# Patient Record
Sex: Female | Born: 1980 | Race: Black or African American | Hispanic: No | Marital: Single | Smoking: Never smoker
Health system: Southern US, Community
[De-identification: ages and names within clinical notes are randomized; demographics above are authoritative.]

## PROBLEM LIST (undated history)

## (undated) DIAGNOSIS — R51 Headache: Secondary | ICD-10-CM

## (undated) DIAGNOSIS — H6690 Otitis media, unspecified, unspecified ear: Secondary | ICD-10-CM

## (undated) DIAGNOSIS — D649 Anemia, unspecified: Secondary | ICD-10-CM

## (undated) HISTORY — PX: OTHER SURGICAL HISTORY: SHX169

## (undated) HISTORY — PX: MYRINGOTOMY: SUR874

## (undated) HISTORY — PX: TONSILLECTOMY: SUR1361

---
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
## 2005-01-05 ENCOUNTER — Other Ambulatory Visit: Admission: RE | Admit: 2005-01-05 | Discharge: 2005-01-05 | Payer: Self-pay | Admitting: Obstetrics and Gynecology

## 2005-05-07 ENCOUNTER — Emergency Department (HOSPITAL_COMMUNITY): Admission: EM | Admit: 2005-05-07 | Discharge: 2005-05-07 | Payer: Self-pay | Admitting: Family Medicine

## 2005-11-26 ENCOUNTER — Ambulatory Visit (HOSPITAL_COMMUNITY): Admission: RE | Admit: 2005-11-26 | Discharge: 2005-11-26 | Payer: Self-pay | Admitting: Obstetrics and Gynecology

## 2005-12-04 ENCOUNTER — Observation Stay (HOSPITAL_COMMUNITY): Admission: AD | Admit: 2005-12-04 | Discharge: 2005-12-04 | Payer: Self-pay | Admitting: Obstetrics and Gynecology

## 2005-12-07 ENCOUNTER — Inpatient Hospital Stay (HOSPITAL_COMMUNITY): Admission: AD | Admit: 2005-12-07 | Discharge: 2005-12-07 | Payer: Self-pay | Admitting: Obstetrics & Gynecology

## 2005-12-10 ENCOUNTER — Inpatient Hospital Stay (HOSPITAL_COMMUNITY): Admission: AD | Admit: 2005-12-10 | Discharge: 2005-12-10 | Payer: Self-pay | Admitting: Obstetrics & Gynecology

## 2005-12-17 ENCOUNTER — Inpatient Hospital Stay (HOSPITAL_COMMUNITY): Admission: AD | Admit: 2005-12-17 | Discharge: 2005-12-17 | Payer: Self-pay | Admitting: Obstetrics & Gynecology

## 2005-12-23 ENCOUNTER — Inpatient Hospital Stay (HOSPITAL_COMMUNITY): Admission: AD | Admit: 2005-12-23 | Discharge: 2005-12-23 | Payer: Self-pay | Admitting: Obstetrics and Gynecology

## 2005-12-31 ENCOUNTER — Inpatient Hospital Stay (HOSPITAL_COMMUNITY): Admission: AD | Admit: 2005-12-31 | Discharge: 2005-12-31 | Payer: Self-pay | Admitting: Obstetrics and Gynecology

## 2006-01-07 ENCOUNTER — Inpatient Hospital Stay (HOSPITAL_COMMUNITY): Admission: AD | Admit: 2006-01-07 | Discharge: 2006-01-07 | Payer: Self-pay | Admitting: Obstetrics & Gynecology

## 2006-01-14 ENCOUNTER — Inpatient Hospital Stay (HOSPITAL_COMMUNITY): Admission: AD | Admit: 2006-01-14 | Discharge: 2006-01-14 | Payer: Self-pay | Admitting: Obstetrics & Gynecology

## 2007-08-05 ENCOUNTER — Inpatient Hospital Stay (HOSPITAL_COMMUNITY): Admission: AD | Admit: 2007-08-05 | Discharge: 2007-08-05 | Payer: Self-pay | Admitting: Obstetrics and Gynecology

## 2008-03-23 ENCOUNTER — Inpatient Hospital Stay (HOSPITAL_COMMUNITY): Admission: AD | Admit: 2008-03-23 | Discharge: 2008-03-26 | Payer: Self-pay | Admitting: Obstetrics & Gynecology

## 2008-03-28 ENCOUNTER — Encounter: Admission: RE | Admit: 2008-03-28 | Discharge: 2008-04-27 | Payer: Self-pay | Admitting: Obstetrics and Gynecology

## 2008-04-28 ENCOUNTER — Encounter: Admission: RE | Admit: 2008-04-28 | Discharge: 2008-05-02 | Payer: Self-pay | Admitting: Obstetrics and Gynecology

## 2010-06-28 NOTE — L&D Delivery Note (Signed)
Delivery Note At 5:13 PM a viable female was delivered via Vaginal, Spontaneous Delivery (Presentation: Right Occiput Anterior).  APGAR:8,4, 8; 8#9.6 weight .   Placenta status: Intact, Spontaneous.  Cord: 3 vessels with the following complications: .  Cord pH: 7.28  Pt pushed well. With crowning of the infants head turtling was noted and an extra delivery assistance was requested.  With delivery of the infants head the anterior shoulder also came easily. The posterior shoulder and body also delivered easily.  The infant cried vigorously after delivery and had good tone.  The infant was then passed to the maternal abdomen.  However after a brief amount of time on the mothers abdomen the infant was noted to be poorly responsive.  The cord was immediately clamped and cut and the infant was passed to the waiting isolet and a code apgar was called. Please refer to the neonatal resuscitation note for complete record of the infants resuscitation.  Cord blood was collected for the lab and blood banking.  The placenta was delivered spontaneously, intact with 3V cord.  A 1st degree perineal laceration was repaired with 3-0 vicryl.  Baby was doing well but was transferred to the Nursery for close observation following delivery.  Anesthesia: Epidural  Episiotomy: None Lacerations: 1st degree;Perineal Suture Repair: 3.0 vicryl Est. Blood Loss (mL): 300  Mom to postpartum.  Baby to nursery-stable.  ROSS,KENDRA H. 02/24/2011, 5:48 PM

## 2010-07-15 ENCOUNTER — Ambulatory Visit (HOSPITAL_COMMUNITY)
Admission: RE | Admit: 2010-07-15 | Discharge: 2010-07-15 | Payer: Self-pay | Source: Home / Self Care | Attending: Obstetrics and Gynecology | Admitting: Obstetrics and Gynecology

## 2010-07-18 ENCOUNTER — Other Ambulatory Visit (HOSPITAL_COMMUNITY): Payer: Self-pay | Admitting: Obstetrics and Gynecology

## 2010-07-18 DIAGNOSIS — Z3682 Encounter for antenatal screening for nuchal translucency: Secondary | ICD-10-CM

## 2010-07-29 ENCOUNTER — Other Ambulatory Visit (HOSPITAL_COMMUNITY): Payer: Self-pay

## 2010-07-29 ENCOUNTER — Other Ambulatory Visit (HOSPITAL_COMMUNITY): Payer: Self-pay | Admitting: Obstetrics and Gynecology

## 2010-07-29 ENCOUNTER — Ambulatory Visit (HOSPITAL_COMMUNITY)
Admission: RE | Admit: 2010-07-29 | Discharge: 2010-07-29 | Disposition: A | Discharge status: Home / Self Care | Payer: 59 | Source: Ambulatory Visit | Attending: Obstetrics and Gynecology | Admitting: Obstetrics and Gynecology

## 2010-07-29 DIAGNOSIS — O358XX0 Maternal care for other (suspected) fetal abnormality and damage, not applicable or unspecified: Secondary | ICD-10-CM | POA: Insufficient documentation

## 2010-07-29 DIAGNOSIS — Z3689 Encounter for other specified antenatal screening: Secondary | ICD-10-CM | POA: Insufficient documentation

## 2010-07-29 DIAGNOSIS — Z3682 Encounter for antenatal screening for nuchal translucency: Secondary | ICD-10-CM

## 2010-08-06 ENCOUNTER — Inpatient Hospital Stay (HOSPITAL_COMMUNITY)
Admission: AD | Admit: 2010-08-06 | Discharge: 2010-08-06 | Disposition: A | Discharge status: Home / Self Care | Payer: 59 | Source: Ambulatory Visit | Attending: Obstetrics and Gynecology | Admitting: Obstetrics and Gynecology

## 2010-08-06 DIAGNOSIS — O210 Mild hyperemesis gravidarum: Secondary | ICD-10-CM | POA: Insufficient documentation

## 2010-08-06 LAB — URINALYSIS, ROUTINE W REFLEX MICROSCOPIC
Leukocytes, UA: NEGATIVE
Urine Glucose, Fasting: NEGATIVE mg/dL
pH: 6 (ref 5.0–8.0)

## 2010-08-06 LAB — URINE MICROSCOPIC-ADD ON

## 2010-08-12 LAB — ANTIBODY SCREEN: Antibody Screen: NEGATIVE

## 2010-08-12 LAB — RPR: RPR: NONREACTIVE

## 2010-08-12 LAB — HIV ANTIBODY (ROUTINE TESTING W REFLEX): HIV: NONREACTIVE

## 2010-08-12 LAB — HEPATITIS B SURFACE ANTIGEN: Hepatitis B Surface Ag: NEGATIVE

## 2010-11-10 NOTE — Consult Note (Signed)
Leah Cochran, Leah Cochran              ACCOUNT NO.:  [id number]   MEDICAL RECORD NO.:  [id number]          PATIENT TYPE:  INP   LOCATION:  9136                          FACILITY:  [facility]   PHYSICIAN:  Melvyn Novas, M.D.  DATE OF BIRTH:  Oct 24, 1980   DATE OF CONSULTATION:  DATE OF DISCHARGE:  03/26/2008                                 CONSULTATION   REASON FOR CONSULTATION:  This is a 30 year old woman 2 days postpartum  who presented to her primary care doctor with slurred speech.   HISTORY OF PRESENT ILLNESS:  According to family, approximately 2 days  post birth the patient had a stuttering-like speech.  However, the  patient has also been slightly depressed post delivery.  Upon interview,  the patient would stare forward, had staccato-like speech.  When asked  what was wrong, the patient stuttered stating, I don't know.  The  patient was not cooperative with exam and would sit staring at  television.  When asked to participate, the patient would not respond.  The patient did look toward were voices were coming from and would move  all extremities during exam.  During exam, the patient also was noted to  start crying.  According to family, this is not her normal typical  demeanor.  Usually, she is an outward young female.  Today, according to  the family, it was explained to the patient that she is to be  discharged.  On hearing about today being her discharge date is when her  staccato-like speech started and the staring spells started.  We were  consulted primarily to rule out possible seizure or reasons for abnormal  speech.   PAST MEDICAL HISTORY:  Includes a negative past medical history.   MEDICATIONS:  Negative for medications other than multivitamin.   REVIEW OF SYSTEMS:  All 12 review of systems were reviewed and the  patient showed to be negative for all 12 review of systems.   PHYSICAL EXAM:  VITAL SIGNS:  Stable.  Blood pressure being 130/80,  pulse of 72, respiration  of 18, temperature of 98.5.  MENTAL STATUS:  The patient, as stated, was alert.  However, she was not  obeying commands.  She would answer questions at this time but would not  follow fingers or move extremities to commands.  CRANIAL NERVES:  Pupils were equal, round, and reactive to light and  accommodating.  She did have a conjugate gaze when spontaneously moving  eyes.  Extraocular muscles were intact.  She had negative ptosis.  Negative visual field deficit.  The patient did blink to threat.  Negative nystagmus.  Face was symmetrical.  Tongue was midline.  Uvula  was midline.  The patient did have with a staccato-like speech and did  answer questions.  She had no facial droop.  Sensation V1-V3 were  intact.  The patient did have a normal shoulder shrug and head turn.  The patient would not follow commands as far finger-to-nose, heel-to-  shin or fine motor movements.  Gait was deferred.  MOTOR:  She is moving all 4 extremities with 5/5 strength spontaneously  with deep tendon reflexes all being 2+ globally.  She had bilaterally  downgoing toes.  She had no tremor, no asterixis, no clonus, no  fasciculations.  She had good bulk and tone.  DRIFT:  The patient had negative drift of her upper or lower extremities  PULMONARY:  She was clear to auscultation bilaterally.  No rhonchi or  wheezing.  CARDIOVASCULAR:  S1, S2 audible.  She had no bruits.  No murmurs.  NECK:  Supple with no bruits.  SENSATION:  To pinprick globally intact as the patient winced to  pinprick with the upper and lower extremities, and also pulled away when  plantar surface of the foot was stroked.  This should be added, the  patient did have strange demeanor as she was initially in a staring-like  spell when first interviewed, answering I don't know what is wrong,  then broke out into a crying phase and started moving extremities  without a problem.   IDENTIFICATION:  Labs CT of head without contrast showed the  brain has a  normal appearance without evidence of atrophy, infarction, mass, lesion,  hemorrhage, hydrocephalous, or extra-axial collections.  Impression was  normal EEG at this time.  Conclusion was normal study in awake, drowsy  and sleep states.  Uric acid and was found to be within normal limits at  3.9.  LVH was found to be within normal limits at 148.  Sodium was 133,  potassium 3.7, chloride 103, bicarb 24, glucose 83, BUN 3, creatinine of  0.51.  White blood cells 10, hemoglobin/hematocrit 10.8 and 32.6 with  platelet count of 227,000.   ASSESSMENT:  At this time, most likely this is not a seizure, stroke or  headache.  Most likely this is a non-organic pathology.  We will be  consulting psychology to see this patient.  This could be a form of  postpartum depression as the patient was found to have started symptoms  today which is the same day and she was told that she was to be  discharged.  The patient had no trauma during birth and has never  reviewed this type of behavior prior to this event.  As stated, the EEG  was normal and CT came back normal.   PLAN:  At this time, will follow up on any other lab results and on what  psych evaluation shows.  Thank you very much for this consultation.  If  you have any more questions, please contact Dr. Vickey Huger.     ______________________________  Felicie Morn, PA-C      Melvyn Novas, M.D.  Electronically Signed    DS/MEDQ  D:  04/08/2008  T:  04/08/2008  Job:  [id number]

## 2010-11-10 NOTE — Procedures (Signed)
ATTENDING PHYSICIAN:  Gerrit Friends. Aldona Bar, MD   CLINICAL HISTORY:  A 30 year old woman 2 days postpartum with slurred  speech.  EEG is performed for evaluation.   DESCRIPTION:  The dominant waking rhythm in this tracing is a moderate-  to-high amplitude alpha rhythm of 10 Hz which predominates posteriorly,  appears without abnormal asymmetry, and attenuates with eye opening and  closing.  Low amplitude fast activity is seen frontally and centrally  and appears without abnormal asymmetry.  No focal slowing is noted and  no epileptiform disorders are seen.  Most of the recording is made in  drowsiness and sleep, the stage II sleep dominates the record as  evidenced by abundant sleep spindles and some vertex waves and K  complexes.  No abnormalities are seen in drowsiness or sleep.  Photic  stimulation and hyperventilation were not performed.  Single-channel  devoted EKG revealed sinus rhythm throughout with rate approximately 90  beats per minute.   CONCLUSION:  Normal study in awake, drowsy, and sleep states.      Michael L. Thad Ranger, M.D.  Electronically Signed     ZOX:WRUE  D:  03/26/2008 16:21:24  T:  03/27/2008 04:06:23  Job #:  [id number]

## 2011-02-22 ENCOUNTER — Telehealth (HOSPITAL_COMMUNITY): Payer: Self-pay | Admitting: *Deleted

## 2011-02-22 ENCOUNTER — Encounter (HOSPITAL_COMMUNITY): Payer: Self-pay | Admitting: *Deleted

## 2011-02-22 NOTE — Telephone Encounter (Signed)
Preadmission screen  

## 2011-02-24 ENCOUNTER — Encounter (HOSPITAL_COMMUNITY): Payer: Self-pay

## 2011-02-24 ENCOUNTER — Encounter (HOSPITAL_COMMUNITY): Payer: Self-pay | Admitting: Anesthesiology

## 2011-02-24 ENCOUNTER — Inpatient Hospital Stay (HOSPITAL_COMMUNITY): Payer: 59 | Admitting: Anesthesiology

## 2011-02-24 ENCOUNTER — Inpatient Hospital Stay (HOSPITAL_COMMUNITY)
Admission: RE | Admit: 2011-02-24 | Discharge: 2011-02-26 | DRG: 775 | Disposition: A | Discharge status: Home / Self Care | Payer: 59 | Source: Ambulatory Visit | Attending: Obstetrics and Gynecology | Admitting: Obstetrics and Gynecology

## 2011-02-24 DIAGNOSIS — O480 Post-term pregnancy: Principal | ICD-10-CM | POA: Diagnosis present

## 2011-02-24 LAB — CBC
HCT: 36 % (ref 36.0–46.0)
Hemoglobin: 11.8 g/dL — ABNORMAL LOW (ref 12.0–15.0)
MCH: 28.1 pg (ref 26.0–34.0)
MCHC: 32.8 g/dL (ref 30.0–36.0)
RBC: 4.2 MIL/uL (ref 3.87–5.11)

## 2011-02-24 LAB — RPR: RPR Ser Ql: NONREACTIVE

## 2011-02-24 MED ORDER — BENZOCAINE-MENTHOL 20-0.5 % EX AERO: 1.0000 "application " | INHALATION_SPRAY | CUTANEOUS | Status: DC | PRN

## 2011-02-24 MED ORDER — DIBUCAINE 1 % RE OINT: 1.0000 "application " | TOPICAL_OINTMENT | RECTAL | Status: DC | PRN

## 2011-02-24 MED ORDER — ZOLPIDEM TARTRATE 5 MG PO TABS: 5.0000 mg | ORAL_TABLET | Freq: Every evening | ORAL | Status: DC | PRN

## 2011-02-24 MED ORDER — CITRIC ACID-SODIUM CITRATE 334-500 MG/5ML PO SOLN: 30.0000 mL | ORAL | Status: DC | PRN

## 2011-02-24 MED ORDER — EPHEDRINE 5 MG/ML INJ
10.0000 mg | INTRAVENOUS | Status: DC | PRN
  Administered 2011-02-24: 10 mg via INTRAVENOUS
  Filled 2011-02-24 (×3): qty 4

## 2011-02-24 MED ORDER — DIPHENHYDRAMINE HCL 25 MG PO CAPS: 25.0000 mg | ORAL_CAPSULE | Freq: Four times a day (QID) | ORAL | Status: DC | PRN

## 2011-02-24 MED ORDER — LACTATED RINGERS IV SOLN
500.0000 mL | INTRAVENOUS | Status: DC | PRN
  Administered 2011-02-24: 500 mL via INTRAVENOUS
  Administered 2011-02-24: 1000 mL via INTRAVENOUS

## 2011-02-24 MED ORDER — LACTATED RINGERS IV SOLN
INTRAVENOUS | Status: DC
  Administered 2011-02-24 (×3): 125 mL/h via INTRAVENOUS

## 2011-02-24 MED ORDER — WITCH HAZEL-GLYCERIN EX PADS: 1.0000 "application " | MEDICATED_PAD | CUTANEOUS | Status: DC | PRN

## 2011-02-24 MED ORDER — ONDANSETRON HCL 4 MG/2ML IJ SOLN: 4.0000 mg | Freq: Four times a day (QID) | INTRAMUSCULAR | Status: DC | PRN

## 2011-02-24 MED ORDER — LACTATED RINGERS IV SOLN: 500.0000 mL | Freq: Once | INTRAVENOUS | Status: DC

## 2011-02-24 MED ORDER — SENNOSIDES-DOCUSATE SODIUM 8.6-50 MG PO TABS
2.0000 | ORAL_TABLET | Freq: Every day | ORAL | Status: DC
  Administered 2011-02-24 – 2011-02-25 (×2): 2 via ORAL

## 2011-02-24 MED ORDER — FLEET ENEMA 7-19 GM/118ML RE ENEM: 1.0000 | ENEMA | RECTAL | Status: DC | PRN

## 2011-02-24 MED ORDER — FENTANYL 2.5 MCG/ML BUPIVACAINE 1/10 % EPIDURAL INFUSION (WH - ANES)
INTRAMUSCULAR | Status: DC | PRN
  Administered 2011-02-24: 14 mL/h via EPIDURAL

## 2011-02-24 MED ORDER — TETANUS-DIPHTH-ACELL PERTUSSIS 5-2.5-18.5 LF-MCG/0.5 IM SUSP
0.5000 mL | Freq: Once | INTRAMUSCULAR | Status: AC
  Administered 2011-02-25: 0.5 mL via INTRAMUSCULAR
  Filled 2011-02-24: qty 0.5

## 2011-02-24 MED ORDER — OXYTOCIN 20 UNITS IN LACTATED RINGERS INFUSION - SIMPLE
1.0000 m[IU]/min | INTRAVENOUS | Status: DC
  Administered 2011-02-24: 2 m[IU]/min via INTRAVENOUS
  Filled 2011-02-24: qty 1000

## 2011-02-24 MED ORDER — BENZOCAINE-MENTHOL 20-0.5 % EX AERO
INHALATION_SPRAY | CUTANEOUS | Status: AC
  Filled 2011-02-24: qty 56

## 2011-02-24 MED ORDER — TERBUTALINE SULFATE 1 MG/ML IJ SOLN: 0.2500 mg | Freq: Once | INTRAMUSCULAR | Status: DC | PRN

## 2011-02-24 MED ORDER — LANOLIN HYDROUS EX OINT: TOPICAL_OINTMENT | CUTANEOUS | Status: DC | PRN

## 2011-02-24 MED ORDER — ACETAMINOPHEN 325 MG PO TABS: 650.0000 mg | ORAL_TABLET | ORAL | Status: DC | PRN

## 2011-02-24 MED ORDER — IBUPROFEN 600 MG PO TABS: 600.0000 mg | ORAL_TABLET | Freq: Four times a day (QID) | ORAL | Status: DC | PRN

## 2011-02-24 MED ORDER — ONDANSETRON HCL 4 MG PO TABS: 4.0000 mg | ORAL_TABLET | ORAL | Status: DC | PRN

## 2011-02-24 MED ORDER — DIPHENHYDRAMINE HCL 50 MG/ML IJ SOLN: 12.5000 mg | INTRAMUSCULAR | Status: DC | PRN

## 2011-02-24 MED ORDER — OXYTOCIN BOLUS FROM INFUSION
500.0000 mL | Freq: Once | INTRAVENOUS | Status: AC
  Administered 2011-02-24: 500 mL via INTRAVENOUS
  Filled 2011-02-24: qty 500

## 2011-02-24 MED ORDER — LIDOCAINE HCL (PF) 1 % IJ SOLN
30.0000 mL | INTRAMUSCULAR | Status: DC | PRN
  Filled 2011-02-24: qty 30

## 2011-02-24 MED ORDER — NALBUPHINE SYRINGE 5 MG/0.5 ML
10.0000 mg | INJECTION | INTRAMUSCULAR | Status: DC | PRN
  Filled 2011-02-24: qty 1

## 2011-02-24 MED ORDER — PENICILLIN G POTASSIUM 5000000 UNITS IJ SOLR
5.0000 10*6.[IU] | Freq: Once | INTRAVENOUS | Status: DC
  Filled 2011-02-24: qty 5

## 2011-02-24 MED ORDER — OXYTOCIN 20 UNITS IN LACTATED RINGERS INFUSION - SIMPLE: 125.0000 mL/h | INTRAVENOUS | Status: DC

## 2011-02-24 MED ORDER — PHENYLEPHRINE 40 MCG/ML (10ML) SYRINGE FOR IV PUSH (FOR BLOOD PRESSURE SUPPORT)
80.0000 ug | PREFILLED_SYRINGE | INTRAVENOUS | Status: DC | PRN
  Filled 2011-02-24 (×2): qty 5

## 2011-02-24 MED ORDER — EPHEDRINE 5 MG/ML INJ
10.0000 mg | INTRAVENOUS | Status: DC | PRN
  Administered 2011-02-24: 10 mg via INTRAVENOUS
  Filled 2011-02-24: qty 4

## 2011-02-24 MED ORDER — OXYCODONE-ACETAMINOPHEN 5-325 MG PO TABS
1.0000 | ORAL_TABLET | ORAL | Status: DC | PRN
  Administered 2011-02-25: 1 via ORAL
  Filled 2011-02-24: qty 1

## 2011-02-24 MED ORDER — SIMETHICONE 80 MG PO CHEW: 80.0000 mg | CHEWABLE_TABLET | ORAL | Status: DC | PRN

## 2011-02-24 MED ORDER — PRENATAL PLUS 27-1 MG PO TABS
1.0000 | ORAL_TABLET | Freq: Every day | ORAL | Status: DC
  Administered 2011-02-25: 1 via ORAL
  Filled 2011-02-24 (×2): qty 1

## 2011-02-24 MED ORDER — OXYCODONE-ACETAMINOPHEN 5-325 MG PO TABS: 2.0000 | ORAL_TABLET | ORAL | Status: DC | PRN

## 2011-02-24 MED ORDER — PHENYLEPHRINE 40 MCG/ML (10ML) SYRINGE FOR IV PUSH (FOR BLOOD PRESSURE SUPPORT)
80.0000 ug | PREFILLED_SYRINGE | INTRAVENOUS | Status: DC | PRN
  Filled 2011-02-24: qty 5

## 2011-02-24 MED ORDER — IBUPROFEN 600 MG PO TABS
600.0000 mg | ORAL_TABLET | Freq: Four times a day (QID) | ORAL | Status: DC
  Administered 2011-02-24 – 2011-02-26 (×6): 600 mg via ORAL
  Filled 2011-02-24 (×7): qty 1

## 2011-02-24 MED ORDER — LIDOCAINE HCL 1.5 % IJ SOLN
INTRAMUSCULAR | Status: DC | PRN
  Administered 2011-02-24 (×2): 5 mL via EPIDURAL

## 2011-02-24 MED ORDER — FENTANYL 2.5 MCG/ML BUPIVACAINE 1/10 % EPIDURAL INFUSION (WH - ANES)
14.0000 mL/h | INTRAMUSCULAR | Status: DC
  Administered 2011-02-24 (×3): 14 mL/h via EPIDURAL
  Filled 2011-02-24 (×3): qty 60

## 2011-02-24 MED ORDER — ONDANSETRON HCL 4 MG/2ML IJ SOLN: 4.0000 mg | INTRAMUSCULAR | Status: DC | PRN

## 2011-02-24 MED ORDER — PENICILLIN G POTASSIUM 5000000 UNITS IJ SOLR
2.5000 10*6.[IU] | INTRAVENOUS | Status: DC
  Filled 2011-02-24 (×5): qty 2.5

## 2011-02-24 NOTE — Anesthesia Procedure Notes (Signed)
Epidural Patient location during procedure: OB Start time: 02/24/2011 10:23 AM End time: 02/24/2011 10:28 AM Reason for block: procedure for pain  Staffing Anesthesiologist: Sandrea Hughs Performed by: anesthesiologist   Preanesthetic Checklist Completed: patient identified, site marked, surgical consent, pre-op evaluation, timeout performed, IV checked, risks and benefits discussed and monitors and equipment checked  Epidural Patient position: sitting Prep: site prepped and draped and DuraPrep Patient monitoring: continuous pulse ox and blood pressure Approach: midline Injection technique: LOR air  Needle:  Needle type: Tuohy  Needle gauge: 17 G Needle length: 9 cm Needle insertion depth: 6 cm Catheter type: closed end flexible Catheter size: 19 Gauge Catheter at skin depth: 11 cm Test dose: negative and 1.5% lidocaine  Assessment Events: blood not aspirated, injection not painful, no injection resistance, negative IV test and no paresthesia

## 2011-02-24 NOTE — Progress Notes (Signed)
Sternal rub,calling pt by name.  Pt's response is delayed and speech slow and slightly slurred

## 2011-02-24 NOTE — Progress Notes (Signed)
Pt states she is starting to feel better.  

## 2011-02-24 NOTE — Progress Notes (Signed)
Possible inaccurately reading of pulse rate.  According to pulse ox pt's pulse actually decreased to < 60 bpm

## 2011-02-24 NOTE — Anesthesia Preprocedure Evaluation (Signed)
Anesthesia Evaluation  Name, MR# and DOB Patient awake  General Assessment Comment  Reviewed: Allergy & Precautions, H&P , NPO status , Patient's Chart, lab work & pertinent test results  Airway Mallampati: I TM Distance: >3 FB Neck ROM: full    Dental No notable dental hx.    Pulmonary  clear to auscultation  pulmonary exam normalPulmonary Exam Normal breath sounds clear to auscultation none    Cardiovascular     Neuro/Psych Negative Neurological ROS  Negative Psych ROS  GI/Hepatic/Renal negative GI ROS  negative Liver ROS  negative Renal ROS        Endo/Other  Negative Endocrine ROS (+)      Abdominal Normal abdominal exam  (+)   Musculoskeletal negative musculoskeletal ROS (+)   Hematology negative hematology ROS (+)   Peds  Reproductive/Obstetrics (+) Pregnancy    Anesthesia Other Findings             Anesthesia Physical Anesthesia Plan  ASA: II  Anesthesia Plan: Epidural   Post-op Pain Management:    Induction:   Airway Management Planned:   Additional Equipment:   Intra-op Plan:   Post-operative Plan:   Informed Consent: I have reviewed the patients History and Physical, chart, labs and discussed the procedure including the risks, benefits and alternatives for the proposed anesthesia with the patient or authorized representative who has indicated his/her understanding and acceptance.     Plan Discussed with:   Anesthesia Plan Comments:         Anesthesia Quick Evaluation  

## 2011-02-24 NOTE — Progress Notes (Signed)
Prior to epidural pt happy, outgoing and apprehensive.  After epidural pt not outgoing, talkative, does not act apprehensive will affect almost being flat

## 2011-02-24 NOTE — H&P (Signed)
Leah Cochran is a 30 y.o. female [phone] presenting for postdates IOL @ 41 weeks History OB History    Grav Para Term Preterm Abortions TAB SAB Ect Mult Living   4 1 1  2 1  1  1      Past Medical History  Diagnosis Date  . No pertinent past medical history    Past Surgical History  Procedure Date  . Appendectomy 1999  . Tonsillectomy     30 years old  . Myringotomy     child   Family History: family history includes Cancer in her maternal grandfather and paternal grandfather; Diabetes in her paternal grandmother; Hypertension in her paternal grandmother; and Mental retardation in her cousin. Social History:  reports that she has never smoked. She does not have any smokeless tobacco history on file. She reports that she does not drink alcohol or use illicit drugs.  ROS    Temperature 97.9 F (36.6 C), temperature source Oral, resp. rate 20. Exam Physical Exam   Gen: AOX3 Abd: gravid, soft NT Cvx: 4/50/-2 per last office exam  Prenatal labs: ABO, Rh: O/Positive/-- (02/15 0000) Antibody: Negative (02/15 0000) Rubella:   RPR: Nonreactive, Nonreactive, Nonreactive (02/15 0000)  HBsAg: Negative (02/15 0000)  HIV: Non-reactive, Non-reactive, Non-reactive (02/15 0000)  GBS: Negative (07/20 0000)   Assessment/Plan: 30 yo G4P1021 @ 41 weeks for PD IOL Admit Pitocin + AROM when able Epidural on request  ROSS,KENDRA H. 02/24/2011, 8:02 AM

## 2011-02-24 NOTE — Progress Notes (Signed)
Dr Arby Barrette here and several RN's Epedrine 10 mg ivp given.  HOB lowered

## 2011-02-24 NOTE — Progress Notes (Signed)
Pt watching tv,  Then seemed to be in a trance.  Emergency call initiated

## 2011-02-24 NOTE — Progress Notes (Signed)
  Pt obtained epidural. After epidural pt was noted to be lethargic and poorly responsive. BP was low 70/50. Pt was given IVF bolus and ephedrine and became appropriately responsive. Pt noted during the episode that she wanted to speak but could not make the words come out.  No evidence of seizure activity was noted.  Pt feeling better now. Comfortable with epidural.  FHT 130s with accels, no decels reactive cvx 4-5/ 70/-2 toco irregular q2-5  AROM for chartreuse tinged fluid  A/P FWB reassuring Hypotension resolved Continue pitocin

## 2011-02-24 NOTE — Progress Notes (Signed)
Dr Tenny Craw talked with pt reaffirming that pt's inability to respond to RN was due to the drop in bp

## 2011-02-24 NOTE — Anesthesia Postprocedure Evaluation (Signed)
Anesthesia Post Note  Patient: Leah Cochran  Procedure(s) Performed: * No procedures listed *  Anesthesia type: Epidural  Patient location: Mother/Baby  Post pain: Pain level controlled  Post assessment: Post-op Vital signs reviewed  Last Vitals:  Filed Vitals:   02/24/11 1801  BP: 121/68  Pulse: 92  Temp:   Resp: 18    Post vital signs: Reviewed  Level of consciousness: awake  Complications: No apparent anesthesia complications

## 2011-02-24 NOTE — Progress Notes (Signed)
Pt had similar response as at 1045, not as severe.  Pt able to talk to nurse

## 2011-02-24 NOTE — Progress Notes (Signed)
Dr Tenny Craw here.  Told about earlier events.  Will hold off AROM and restart Pitocin about 1200

## 2011-02-24 NOTE — Progress Notes (Signed)
Pt assisted to bedside commode.  Voided without problem.  Pericare given.and pt assisted to w/c

## 2011-02-25 ENCOUNTER — Encounter (HOSPITAL_COMMUNITY): Payer: Self-pay

## 2011-02-25 LAB — CBC
HCT: 31.7 % — ABNORMAL LOW (ref 36.0–46.0)
MCV: 86.1 fL (ref 78.0–100.0)
RDW: 16.3 % — ABNORMAL HIGH (ref 11.5–15.5)
WBC: 11.5 10*3/uL — ABNORMAL HIGH (ref 4.0–10.5)

## 2011-02-25 NOTE — Progress Notes (Signed)
Appears to be doing well.  Breast feeding.  CBC today:  10.2/11.5  219 K Platelets.  Baby to be circ'ed today.

## 2011-02-25 NOTE — Progress Notes (Signed)
Encounter addended by: Doreene Burke on: 02/25/2011  8:21 AM<BR>     Documentation filed: Notes Section, Charges VN

## 2011-02-25 NOTE — Anesthesia Postprocedure Evaluation (Signed)
Anesthesia Post Note  Patient: Leah Cochran  Procedure(s) Performed: * No procedures listed *  Anesthesia type: Epidural  Patient location: Mother/Baby  Post pain: Pain level controlled  Post assessment: Post-op Vital signs reviewed  Last Vitals:  Filed Vitals:   02/25/11 0100  BP: 102/61  Pulse: 84  Temp: 98.1 F (36.7 C)  Resp: 18    Post vital signs: Reviewed  Level of consciousness: awake  Complications: No apparent anesthesia complications

## 2011-02-26 NOTE — Discharge Summary (Addendum)
Obstetric Discharge Summary Reason for Admission: induction of labor; Post date  Prenatal Procedures: ultrasound Intrapartum Procedures: spontaneous vaginal delivery Postpartum Procedures: none Complications-Operative and Postpartum: 1st degree perineal laceration Hemoglobin  Date Value Range Status  02/25/2011 10.2* 12.0-15.0 (g/dL) Final     HCT  Date Value Range Status  02/25/2011 31.7* 36.0-46.0 (%) Final    Discharge Diagnoses: Post-date pregnancy-delivered  Discharge Information: Date: 02/26/2011 Activity: pelvic rest Diet: routine Medications: PNV and Ibuprophen Condition: stable Instructions: refer to practice specific booklet Discharge to: home Follow-up Information    Follow up with ROSS,KENDRA H.. Make an appointment in 5 weeks.   Contact information:   [address] [place] [address] [phone]          Newborn Data: Live born female  Birth Weight: 8 lb 9.6 oz (3900 g) APGAR: 8, 4  Home with mother.  KAPLAN,RICHARD D 02/26/2011, 9:24 AM

## 2011-02-26 NOTE — Progress Notes (Signed)
Post Partum Day 2 Subjective: no complaints  Objective: Blood pressure 100/62, pulse 96, temperature 97.9 F (36.6 C), temperature source Oral, resp. rate 18, height 5\' 9"  (1.753 m), weight 95.255 kg (210 lb), SpO2 99.00%, unknown if currently breastfeeding.  Physical Exam:  General: alert Lochia: appropriate Uterine Fundus: firm   Basename 02/25/11 0540 02/24/11 0825  HGB 10.2* 11.8*  HCT 31.7* 36.0    Assessment/Plan: Discharge home, Breastfeeding and Circumcision prior to discharge   LOS: 2 days   Leah Cochran D 02/26/2011, 9:17 AM

## 2011-03-29 LAB — BASIC METABOLIC PANEL
Calcium: 8.8
Chloride: 103
Creatinine, Ser: 0.51
GFR calc Af Amer: >60
GFR calc non Af Amer: >60

## 2011-03-29 LAB — LACTATE DEHYDROGENASE: LDH: 148

## 2011-03-29 LAB — URIC ACID: Uric Acid, Serum: 3.9

## 2011-03-29 LAB — CBC
Hemoglobin: 12.1
MCHC: 33.2
MCHC: 33.5
MCV: 89.2
Platelets: 224
RBC: 3.63 — ABNORMAL LOW
RDW: 15.9 — ABNORMAL HIGH
RDW: 16 — ABNORMAL HIGH
WBC: 10
WBC: 14.3 — ABNORMAL HIGH

## 2011-06-02 ENCOUNTER — Ambulatory Visit (INDEPENDENT_AMBULATORY_CARE_PROVIDER_SITE_OTHER): Payer: 59

## 2011-06-02 DIAGNOSIS — B9789 Other viral agents as the cause of diseases classified elsewhere: Secondary | ICD-10-CM

## 2011-06-02 DIAGNOSIS — H66009 Acute suppurative otitis media without spontaneous rupture of ear drum, unspecified ear: Secondary | ICD-10-CM

## 2011-07-02 ENCOUNTER — Other Ambulatory Visit: Payer: Self-pay | Admitting: Obstetrics and Gynecology

## 2011-07-14 ENCOUNTER — Encounter (HOSPITAL_COMMUNITY): Payer: Self-pay | Admitting: Pharmacist

## 2011-07-22 ENCOUNTER — Encounter (HOSPITAL_COMMUNITY): Payer: Self-pay

## 2011-07-22 ENCOUNTER — Encounter (HOSPITAL_COMMUNITY)
Admission: RE | Admit: 2011-07-22 | Discharge: 2011-07-22 | Disposition: A | Discharge status: Home / Self Care | Payer: 59 | Source: Ambulatory Visit | Attending: Obstetrics and Gynecology | Admitting: Obstetrics and Gynecology

## 2011-07-22 HISTORY — DX: Anemia, unspecified: D64.9

## 2011-07-22 HISTORY — DX: Headache: R51

## 2011-07-22 HISTORY — DX: Otitis media, unspecified, unspecified ear: H66.90

## 2011-07-22 LAB — CBC
Hemoglobin: 12.4 g/dL (ref 12.0–15.0)
RBC: 4.28 MIL/uL (ref 3.87–5.11)

## 2011-07-22 LAB — SURGICAL PCR SCREEN
MRSA, PCR: NEGATIVE
Staphylococcus aureus: NEGATIVE

## 2011-07-22 NOTE — Patient Instructions (Addendum)
   Your procedure is scheduled on: Wednesday, Jan 30th  Enter through the [employer] [facility] at: 10am Pick up the phone at the desk and dial [phone] and inform us of your arrival.  Please call this number if you have any problems the morning of surgery: [phone]  Remember: Do not eat food after midnight: Tuesday Do not drink clear liquids after: Tuesday Take these medicines the morning of surgery with a SIP OF WATER: None  Do not wear jewelry, make-up, or FINGER nail polish Do not wear lotions, powders, perfumes or deodorant. Do not shave 48 hours prior to surgery. Do not bring valuables to the hospital.  Patients discharged on the day of surgery will not be allowed to drive home.  Home with [facility] cell [phone]   Remember to use your hibiclens as instructed.Please shower with 1/2 bottle the evening before your surgery and the other 1/2 bottle the morning of surgery.

## 2011-07-22 NOTE — Pre-Procedure Instructions (Signed)
OK to see patient DOS 

## 2011-07-27 MED ORDER — DEXTROSE 5 % IV SOLN
2.0000 g | INTRAVENOUS | Status: AC
  Administered 2011-07-28: 2 g via INTRAVENOUS
  Filled 2011-07-27: qty 2

## 2011-07-27 NOTE — H&P (Signed)
31 y.o. yo desires sterilization.  Pt has history of large incision from rupture appendix.  Pt strongly desires permanent and effective sterilization.  Past Medical History  Diagnosis Date  . Anemia   . Ear infection     tx w/ abx -left ear  . Headache     history - no problem since 2008, no meds   Past Surgical History  Procedure Date  . Appendectomy 1999  . Tonsillectomy     31 years old  . Myringotomy     child  . Svd     x 2    History   Social History  . Marital Status: Single    Spouse Name: N/A    Number of Children: N/A  . Years of Education: N/A   Occupational History  . Not on file.   Social History Main Topics  . Smoking status: Never Smoker   . Smokeless tobacco: Never Used  . Alcohol Use: Yes     occasionally glass of wine  . Drug Use: No  . Sexually Active: Yes    Birth Control/ Protection: Pill     undecided   Other Topics Concern  . Not on file   Social History Narrative  . No narrative on file    No current facility-administered medications on file prior to encounter.   No current outpatient prescriptions on file prior to encounter.    No Known Allergies  @VITALS2 @  Lungs: clear to ascultation Cor:  RRR Abdomen:  soft, nontender, nondistended. Ex:  no cords, erythema Pelvic:  nml EFG, nml s/s uterus, no masses  A:  Stongly desires BTL with filschie clips.  Pt understands adhesions may make BTL difficult.  Agrees to Robo LOA to get to tubes if adhesions make filschie clip BTL difficult.    P: .   All risks, benefits and alternatives d/w patient and she desires to proceed with .  Patient has undergone a modified bowel prep and will receive preop antibiotics and SCDs during the operation.     Kanoa Phillippi A

## 2011-07-28 ENCOUNTER — Encounter (HOSPITAL_COMMUNITY)
Admission: RE | Disposition: A | Discharge status: Home / Self Care | Payer: Self-pay | Source: Ambulatory Visit | Attending: Obstetrics and Gynecology

## 2011-07-28 ENCOUNTER — Ambulatory Visit (HOSPITAL_COMMUNITY): Payer: 59 | Admitting: Anesthesiology

## 2011-07-28 ENCOUNTER — Encounter (HOSPITAL_COMMUNITY): Payer: Self-pay | Admitting: *Deleted

## 2011-07-28 ENCOUNTER — Encounter (HOSPITAL_COMMUNITY): Payer: Self-pay | Admitting: Anesthesiology

## 2011-07-28 ENCOUNTER — Ambulatory Visit (HOSPITAL_COMMUNITY)
Admission: RE | Admit: 2011-07-28 | Discharge: 2011-07-28 | Disposition: A | Discharge status: Home / Self Care | Payer: 59 | Source: Ambulatory Visit | Attending: Obstetrics and Gynecology | Admitting: Obstetrics and Gynecology

## 2011-07-28 DIAGNOSIS — Z01818 Encounter for other preprocedural examination: Secondary | ICD-10-CM | POA: Insufficient documentation

## 2011-07-28 DIAGNOSIS — Z9889 Other specified postprocedural states: Secondary | ICD-10-CM

## 2011-07-28 DIAGNOSIS — N803 Endometriosis of pelvic peritoneum, unspecified: Secondary | ICD-10-CM | POA: Insufficient documentation

## 2011-07-28 DIAGNOSIS — Z01812 Encounter for preprocedural laboratory examination: Secondary | ICD-10-CM | POA: Insufficient documentation

## 2011-07-28 DIAGNOSIS — Z302 Encounter for sterilization: Secondary | ICD-10-CM | POA: Insufficient documentation

## 2011-07-28 HISTORY — PX: LAPAROSCOPIC TUBAL LIGATION: SHX1937

## 2011-07-28 LAB — PREGNANCY, URINE: Preg Test, Ur: NEGATIVE

## 2011-07-28 SURGERY — LIGATION, FALLOPIAN TUBE, LAPAROSCOPIC
Anesthesia: General | Laterality: Bilateral

## 2011-07-28 MED ORDER — DEXAMETHASONE SODIUM PHOSPHATE 10 MG/ML IJ SOLN
INTRAMUSCULAR | Status: DC | PRN
  Administered 2011-07-28: 10 mg via INTRAVENOUS

## 2011-07-28 MED ORDER — DEXAMETHASONE SODIUM PHOSPHATE 10 MG/ML IJ SOLN
INTRAMUSCULAR | Status: AC
  Filled 2011-07-28: qty 1

## 2011-07-28 MED ORDER — GLYCOPYRROLATE 0.2 MG/ML IJ SOLN
INTRAMUSCULAR | Status: DC | PRN
  Administered 2011-07-28: .8 mg via INTRAVENOUS

## 2011-07-28 MED ORDER — PROPOFOL 10 MG/ML IV EMUL
INTRAVENOUS | Status: AC
  Filled 2011-07-28: qty 40

## 2011-07-28 MED ORDER — KETOROLAC TROMETHAMINE 30 MG/ML IJ SOLN: 15.0000 mg | Freq: Once | INTRAMUSCULAR | Status: DC | PRN

## 2011-07-28 MED ORDER — ROCURONIUM BROMIDE 100 MG/10ML IV SOLN
INTRAVENOUS | Status: DC | PRN
  Administered 2011-07-28: 50 mg via INTRAVENOUS

## 2011-07-28 MED ORDER — FENTANYL CITRATE 0.05 MG/ML IJ SOLN
25.0000 ug | INTRAMUSCULAR | Status: DC | PRN
  Administered 2011-07-28: 50 ug via INTRAVENOUS

## 2011-07-28 MED ORDER — ONDANSETRON HCL 4 MG/2ML IJ SOLN
INTRAMUSCULAR | Status: DC | PRN
  Administered 2011-07-28: 4 mg via INTRAVENOUS

## 2011-07-28 MED ORDER — NEOSTIGMINE METHYLSULFATE 1 MG/ML IJ SOLN
INTRAMUSCULAR | Status: AC
  Filled 2011-07-28: qty 10

## 2011-07-28 MED ORDER — KETOROLAC TROMETHAMINE 30 MG/ML IJ SOLN
INTRAMUSCULAR | Status: AC
  Filled 2011-07-28: qty 1

## 2011-07-28 MED ORDER — OXYCODONE-ACETAMINOPHEN 5-325 MG PO TABS: 1.0000 | ORAL_TABLET | ORAL | Status: AC | PRN

## 2011-07-28 MED ORDER — ONDANSETRON HCL 4 MG/2ML IJ SOLN
INTRAMUSCULAR | Status: AC
  Filled 2011-07-28: qty 2

## 2011-07-28 MED ORDER — ARTIFICIAL TEARS OP OINT
TOPICAL_OINTMENT | OPHTHALMIC | Status: DC | PRN
  Administered 2011-07-28: 1 via OPHTHALMIC

## 2011-07-28 MED ORDER — ROCURONIUM BROMIDE 50 MG/5ML IV SOLN
INTRAVENOUS | Status: AC
  Filled 2011-07-28: qty 1

## 2011-07-28 MED ORDER — SCOPOLAMINE 1 MG/3DAYS TD PT72
MEDICATED_PATCH | TRANSDERMAL | Status: AC
  Administered 2011-07-28: 1 via TRANSDERMAL
  Filled 2011-07-28: qty 1

## 2011-07-28 MED ORDER — MIDAZOLAM HCL 2 MG/2ML IJ SOLN
INTRAMUSCULAR | Status: AC
  Filled 2011-07-28: qty 2

## 2011-07-28 MED ORDER — OXYCODONE-ACETAMINOPHEN 5-325 MG PO TABS
ORAL_TABLET | ORAL | Status: AC
  Filled 2011-07-28: qty 1

## 2011-07-28 MED ORDER — SUFENTANIL CITRATE 50 MCG/ML IV SOLN
INTRAVENOUS | Status: AC
  Filled 2011-07-28: qty 1

## 2011-07-28 MED ORDER — KETOROLAC TROMETHAMINE 30 MG/ML IJ SOLN
INTRAMUSCULAR | Status: DC | PRN
  Administered 2011-07-28: 30 mg via INTRAVENOUS

## 2011-07-28 MED ORDER — FENTANYL CITRATE 0.05 MG/ML IJ SOLN
INTRAMUSCULAR | Status: AC
  Filled 2011-07-28: qty 2

## 2011-07-28 MED ORDER — OXYCODONE-ACETAMINOPHEN 5-325 MG PO TABS
1.0000 | ORAL_TABLET | ORAL | Status: DC | PRN
  Administered 2011-07-28: 1 via ORAL

## 2011-07-28 MED ORDER — SUFENTANIL CITRATE 50 MCG/ML IV SOLN
INTRAVENOUS | Status: DC | PRN
  Administered 2011-07-28: 5 ug via INTRAVENOUS
  Administered 2011-07-28: 20 ug via INTRAVENOUS

## 2011-07-28 MED ORDER — NEOSTIGMINE METHYLSULFATE 1 MG/ML IJ SOLN
INTRAMUSCULAR | Status: DC | PRN
  Administered 2011-07-28: 4 mg via INTRAVENOUS

## 2011-07-28 MED ORDER — LIDOCAINE HCL (CARDIAC) 20 MG/ML IV SOLN
INTRAVENOUS | Status: DC | PRN
  Administered 2011-07-28: 60 mg via INTRAVENOUS

## 2011-07-28 MED ORDER — LIDOCAINE HCL (CARDIAC) 20 MG/ML IV SOLN
INTRAVENOUS | Status: AC
  Filled 2011-07-28: qty 5

## 2011-07-28 MED ORDER — MIDAZOLAM HCL 5 MG/5ML IJ SOLN
INTRAMUSCULAR | Status: DC | PRN
  Administered 2011-07-28: 2 mg via INTRAVENOUS

## 2011-07-28 MED ORDER — LACTATED RINGERS IV SOLN
INTRAVENOUS | Status: DC
  Administered 2011-07-28 (×3): via INTRAVENOUS

## 2011-07-28 MED ORDER — PROPOFOL 10 MG/ML IV EMUL
INTRAVENOUS | Status: DC | PRN
  Administered 2011-07-28: 50 mg via INTRAVENOUS
  Administered 2011-07-28: 150 mg via INTRAVENOUS

## 2011-07-28 SURGICAL SUPPLY — 73 items
ADH SKN CLS APL DERMABOND .7 (GAUZE/BANDAGES/DRESSINGS) ×2
APL SKNCLS STERI-STRIP NONHPOA (GAUZE/BANDAGES/DRESSINGS) ×2
BARRIER ADHS 3X4 INTERCEED (GAUZE/BANDAGES/DRESSINGS) ×3 IMPLANT
BENZOIN TINCTURE PRP APPL 2/3 (GAUZE/BANDAGES/DRESSINGS) ×3 IMPLANT
BLADELESS LONG 8MM (BLADE) ×3 IMPLANT
BRR ADH 4X3 ABS CNTRL BYND (GAUZE/BANDAGES/DRESSINGS) ×2
CABLE HIGH FREQUENCY MONO STRZ (ELECTRODE) ×3 IMPLANT
CATH ROBINSON RED A/P 16FR (CATHETERS) ×3 IMPLANT
CHLORAPREP W/TINT 26ML (MISCELLANEOUS) ×3 IMPLANT
CLIP FILSHIE TUBAL LIGA STRL (Clip) ×3 IMPLANT
CLOTH BEACON ORANGE TIMEOUT ST (SAFETY) ×3 IMPLANT
CONT PATH 16OZ SNAP LID 3702 (MISCELLANEOUS) ×3 IMPLANT
COVER MAYO STAND STRL (DRAPES) ×3 IMPLANT
COVER TABLE BACK 60X90 (DRAPES) ×6 IMPLANT
COVER TIP SHEARS 8 DVNC (MISCELLANEOUS) ×2 IMPLANT
COVER TIP SHEARS 8MM DA VINCI (MISCELLANEOUS) ×1
DECANTER SPIKE VIAL GLASS SM (MISCELLANEOUS) ×3 IMPLANT
DERMABOND ADVANCED (GAUZE/BANDAGES/DRESSINGS) ×1
DERMABOND ADVANCED .7 DNX12 (GAUZE/BANDAGES/DRESSINGS) ×2 IMPLANT
DRAPE HUG U DISPOSABLE (DRAPE) ×3 IMPLANT
DRAPE LG THREE QUARTER DISP (DRAPES) ×6 IMPLANT
DRAPE MONITOR DA VINCI (DRAPE) IMPLANT
DRAPE WARM FLUID 44X44 (DRAPE) ×3 IMPLANT
ELECT REM PT RETURN 9FT ADLT (ELECTROSURGICAL) ×3
ELECTRODE REM PT RTRN 9FT ADLT (ELECTROSURGICAL) ×2 IMPLANT
EVACUATOR SMOKE 8.L (FILTER) ×3 IMPLANT
GAUZE VASELINE 3X9 (GAUZE/BANDAGES/DRESSINGS) IMPLANT
GLOVE BIO SURGEON STRL SZ7 (GLOVE) ×9 IMPLANT
GLOVE ECLIPSE 6.5 STRL STRAW (GLOVE) ×9 IMPLANT
GOWN PREVENTION PLUS LG XLONG (DISPOSABLE) ×6 IMPLANT
GOWN STRL REIN XL XLG (GOWN DISPOSABLE) ×18 IMPLANT
KIT ACCESSORY DA VINCI DISP (KITS) ×1
KIT ACCESSORY DVNC DISP (KITS) ×2 IMPLANT
KIT DISP ACCESSORY 4 ARM (KITS) IMPLANT
MANIPULATOR UTERINE 4.5 ZUMI (MISCELLANEOUS) IMPLANT
NDL INSUFFLATION 14GA 120MM (NEEDLE) ×1 IMPLANT
NEEDLE INSUFFLATION 14GA 120MM (NEEDLE) ×3 IMPLANT
NS IRRIG 1000ML POUR BTL (IV SOLUTION) ×9 IMPLANT
OCCLUDER COLPOPNEUMO (BALLOONS) ×6 IMPLANT
PACK LAPAROSCOPY BASIN (CUSTOM PROCEDURE TRAY) ×3 IMPLANT
PACK LAVH (CUSTOM PROCEDURE TRAY) ×3 IMPLANT
PAD PREP 24X48 CUFFED NSTRL (MISCELLANEOUS) ×6 IMPLANT
POSITIONER SURGICAL ARM (MISCELLANEOUS) ×6 IMPLANT
SET IRRIG TUBING LAPAROSCOPIC (IRRIGATION / IRRIGATOR) ×3 IMPLANT
SOLUTION ELECTROLUBE (MISCELLANEOUS) ×3 IMPLANT
SPONGE LAP 18X18 X RAY DECT (DISPOSABLE) IMPLANT
STRIP CLOSURE SKIN 1/2X4 (GAUZE/BANDAGES/DRESSINGS) ×3 IMPLANT
STRIP CLOSURE SKIN 1/4X3 (GAUZE/BANDAGES/DRESSINGS) ×3 IMPLANT
STRIP CLOSURE SKIN 1/4X4 (GAUZE/BANDAGES/DRESSINGS) IMPLANT
SUT VIC AB 0 CT1 27 (SUTURE) ×15
SUT VIC AB 0 CT1 27XBRD ANBCTR (SUTURE) ×10 IMPLANT
SUT VIC AB 2-0 CT2 27 (SUTURE) ×6 IMPLANT
SUT VIC AB 2-0 UR5 27 (SUTURE) ×3 IMPLANT
SUT VICRYL 0 UR6 27IN ABS (SUTURE) ×6 IMPLANT
SUT VICRYL RAPIDE 3 0 (SUTURE) ×6 IMPLANT
SYR 50ML LL SCALE MARK (SYRINGE) ×3 IMPLANT
SYSTEM CONVERTIBLE TROCAR (TROCAR) IMPLANT
TIP UTERINE 5.1X6CM LAV DISP (MISCELLANEOUS) IMPLANT
TIP UTERINE 6.7X10CM GRN DISP (MISCELLANEOUS) IMPLANT
TIP UTERINE 6.7X6CM WHT DISP (MISCELLANEOUS) IMPLANT
TIP UTERINE 6.7X8CM BLUE DISP (MISCELLANEOUS) IMPLANT
TOWEL OR 17X24 6PK STRL BLUE (TOWEL DISPOSABLE) ×6 IMPLANT
TRAY FOLEY BAG SILVER LF 14FR (CATHETERS) ×3 IMPLANT
TROCAR DISP BLADELESS 8 DVNC (TROCAR) ×2 IMPLANT
TROCAR DISP BLADELESS 8MM (TROCAR) ×1
TROCAR XCEL 12X100 BLDLESS (ENDOMECHANICALS) ×3 IMPLANT
TROCAR Z-THREAD 12X150 (TROCAR) IMPLANT
TROCAR Z-THREAD BLADED 11X100M (TROCAR) ×3 IMPLANT
TROCAR Z-THREAD BLADED 12X100M (TROCAR) IMPLANT
TROCAR Z-THREAD FIOS 5X100MM (TROCAR) IMPLANT
TUBING FILTER THERMOFLATOR (ELECTROSURGICAL) ×3 IMPLANT
WARMER LAPAROSCOPE (MISCELLANEOUS) ×3 IMPLANT
WATER STERILE IRR 1000ML POUR (IV SOLUTION) ×3 IMPLANT

## 2011-07-28 NOTE — Brief Op Note (Signed)
07/28/2011  11:27 AM  PATIENT:  Leah Cochran  31 y.o. female  PRE-OPERATIVE DIAGNOSIS:  Desires Sterilization; Possible Adhesions  POST-OPERATIVE DIAGNOSIS:  Desires Sterilization; endometriosis  PROCEDURE:  Procedure(s): LAPAROSCOPIC TUBAL LIGATION  SURGEON:  Surgeon(s): Loney Laurence, MD Philip Aspen, DO  ASSISTANTS: Dr. Claiborne Billings   ANESTHESIA:   general  EBL:  Total I/O In: 1500 [I.V.:1500] Out: 155 [Urine:150; Blood:5]  BLOOD ADMINISTERED:none  DRAINS: none   LOCAL MEDICATIONS USED:  NONE  SPECIMEN:  No Specimen  DISPOSITION OF SPECIMEN:  N/A  COUNTS:  YES  DICTATION: see below.  PLAN OF CARE: Discharge to home after PACU  PATIENT DISPOSITION:  PACU - hemodynamically stable.   Delay start of Pharmacological VTE agent (>24hrs) due to surgical blood loss or risk of bleeding: /NOT APPLICABLE:20182  Meds: none  Complications:  none  Findings: endmetriosis on right pelvic sidewall and large bowel; this left intact as pt is w/o symptoms at this time.  Technique  After adequate general endotracheal anesthesia was achieved, the patient was prepped and draped in the usual sterile fashion.  A 2 cm incision was made just above the umbilicus and the abdominal wall tented up.  Each layer of the abdomen was tented up and incised transversely.  Once the fascia was incised, the angles were secured with 2-0 vicryl.  The peritoneum was incised carefully and the peritoneal cavity entered.  There were no adhesions around the entry site. The 12 mm Hassan trocar placed and the abdomen was insufflated.  The operative scope was introduced and the above findings noted; there were no adhesions and the BTL could proceed without Robotic assistance.  The filchie clip instrument was introduced and the tubes were followed to their fimbriated ends bilaterally.  A filchie clip was placed on the isthmic portion of each tube and confirmed visually to be around the entire circumference  of each tube.  A second clip was placed on the right tube to ensure the tube was completely occluded.  After a quick inspection of the pelvis, all instruments were removed and the abdomen was desufflated.  The 12 mm faschial incision was closed with a running stitch of 2-vicryl and the skin was closed with dermabond after one subcutaneous stitch.  All instruments were removed from the vagina and the patient returned to the PACU in stable condition.  Dawnell Bryant A

## 2011-07-28 NOTE — Op Note (Signed)
07/28/2011  11:27 AM  PATIENT:  Leah Cochran  30 y.o. female  PRE-OPERATIVE DIAGNOSIS:  Desires Sterilization; Possible Adhesions  POST-OPERATIVE DIAGNOSIS:  Desires Sterilization; endometriosis  PROCEDURE:  Procedure(s): LAPAROSCOPIC TUBAL LIGATION  SURGEON:  Surgeon(s): Leah Marzella A Lou Loewe, MD Leah Callahan, DO  ASSISTANTS: Dr. Callahan   ANESTHESIA:   general  EBL:  Total I/O In: 1500 [I.V.:1500] Out: 155 [Urine:150; Blood:5]  BLOOD ADMINISTERED:none  DRAINS: none   LOCAL MEDICATIONS USED:  NONE  SPECIMEN:  No Specimen  DISPOSITION OF SPECIMEN:  N/A  COUNTS:  YES  DICTATION: see below.  PLAN OF CARE: Discharge to home after PACU  PATIENT DISPOSITION:  PACU - hemodynamically stable.   Delay start of Pharmacological VTE agent (>24hrs) due to surgical blood loss or risk of bleeding: /NOT APPLICABLE:20182  Meds: none  Complications:  none  Findings: endmetriosis on right pelvic sidewall and large bowel; this left intact as pt is w/o symptoms at this time.  Technique  After adequate general endotracheal anesthesia was achieved, the patient was prepped and draped in the usual sterile fashion.  A 2 cm incision was made just above the umbilicus and the abdominal wall tented up.  Each layer of the abdomen was tented up and incised transversely.  Once the fascia was incised, the angles were secured with 2-0 vicryl.  The peritoneum was incised carefully and the peritoneal cavity entered.  There were no adhesions around the entry site. The 12 mm Hassan trocar placed and the abdomen was insufflated.  The operative scope was introduced and the above findings noted; there were no adhesions and the BTL could proceed without Robotic assistance.  The filchie clip instrument was introduced and the tubes were followed to their fimbriated ends bilaterally.  A filchie clip was placed on the isthmic portion of each tube and confirmed visually to be around the entire circumference  of each tube.  A second clip was placed on the right tube to ensure the tube was completely occluded.  After a quick inspection of the pelvis, all instruments were removed and the abdomen was desufflated.  The 12 mm faschial incision was closed with a running stitch of 2-vicryl and the skin was closed with dermabond after one subcutaneous stitch.  All instruments were removed from the vagina and the patient returned to the PACU in stable condition.  Leah Cochran A      

## 2011-07-28 NOTE — Transfer of Care (Signed)
Immediate Anesthesia Transfer of Care Note  Patient: Leah Cochran  Procedure(s) Performed:  LAPAROSCOPIC TUBAL LIGATION - FILSHIE CLIPS  Patient Location: PACU  Anesthesia Type: General  Level of Consciousness: alert  and oriented  Airway & Oxygen Therapy: Patient Spontanous Breathing and Patient connected to nasal cannula oxygen  Post-op Assessment: Report given to PACU RN and Post -op Vital signs reviewed and stable  Post vital signs: stable  Complications: No apparent anesthesia complications

## 2011-07-28 NOTE — Progress Notes (Signed)
There has been no change in the patients history, status or exam since the history and physical.  Filed Vitals:   07/28/11 0950  BP: 116/67  Pulse: 85  Temp: 98.1 F (36.7 C)  TempSrc: Oral  Resp: 18  SpO2: 99%    Lab Results  Component Value Date   WBC 5.2 07/22/2011   HGB 12.4 07/22/2011   HCT 38.7 07/22/2011   MCV 90.4 07/22/2011   PLT 277 07/22/2011  upt neg  Neela Zecca A

## 2011-07-28 NOTE — Anesthesia Preprocedure Evaluation (Signed)
Anesthesia Evaluation  Patient identified by MRN, date of birth, ID band Patient awake    Reviewed: Allergy & Precautions, H&P , NPO status , Patient's Chart, lab work & pertinent test results, reviewed documented beta blocker date and time   History of Anesthesia Complications Negative for: history of anesthetic complications  Airway Mallampati: II TM Distance: >3 FB Neck ROM: full    Dental  (+) Teeth Intact   Pulmonary Recent URI  (recent ear infection/tooth abscess treated with antibiotics),  clear to auscultation  Pulmonary exam normal       Cardiovascular neg cardio ROS regular Normal    Neuro/Psych Negative Neurological ROS  Negative Psych ROS   GI/Hepatic negative GI ROS, Neg liver ROS,   Endo/Other  Negative Endocrine ROS  Renal/GU negative Renal ROS  Genitourinary negative   Musculoskeletal   Abdominal   Peds  Hematology negative hematology ROS (+)   Anesthesia Other Findings   Reproductive/Obstetrics negative OB ROS                           Anesthesia Physical Anesthesia Plan  ASA: I  Anesthesia Plan: General ETT   Post-op Pain Management:    Induction:   Airway Management Planned:   Additional Equipment:   Intra-op Plan:   Post-operative Plan:   Informed Consent: I have reviewed the patients History and Physical, chart, labs and discussed the procedure including the risks, benefits and alternatives for the proposed anesthesia with the patient or authorized representative who has indicated his/her understanding and acceptance.   Dental Advisory Given  Plan Discussed with: CRNA and Surgeon  Anesthesia Plan Comments:         Anesthesia Quick Evaluation

## 2011-07-28 NOTE — Anesthesia Postprocedure Evaluation (Signed)
Anesthesia Post Note  Patient: Leah Cochran  Procedure(s) Performed:  LAPAROSCOPIC TUBAL LIGATION - FILSHIE CLIPS  Anesthesia type: GA  Patient location: PACU  Post pain: Pain level controlled  Post assessment: Post-op Vital signs reviewed  Last Vitals:  Filed Vitals:   07/28/11 1300  BP: 113/72  Pulse: 61  Temp:   Resp:     Post vital signs: Reviewed  Level of consciousness: sedated  Complications: No apparent anesthesia complications

## 2011-07-29 ENCOUNTER — Encounter (HOSPITAL_COMMUNITY): Payer: Self-pay | Admitting: Obstetrics and Gynecology

## 2014-01-16 ENCOUNTER — Encounter: Payer: 59 | Attending: Obstetrics and Gynecology | Admitting: Dietician

## 2014-01-16 ENCOUNTER — Encounter: Payer: Self-pay | Admitting: Dietician

## 2014-01-16 VITALS — Ht 69.5 in | Wt 193.9 lb

## 2014-01-16 DIAGNOSIS — Z713 Dietary counseling and surveillance: Secondary | ICD-10-CM | POA: Diagnosis present

## 2014-01-16 DIAGNOSIS — Z8639 Personal history of other endocrine, nutritional and metabolic disease: Secondary | ICD-10-CM

## 2014-01-16 DIAGNOSIS — E663 Overweight: Secondary | ICD-10-CM | POA: Insufficient documentation

## 2014-01-16 DIAGNOSIS — R7309 Other abnormal glucose: Secondary | ICD-10-CM | POA: Diagnosis not present

## 2014-01-16 DIAGNOSIS — Z6828 Body mass index (BMI) 28.0-28.9, adult: Secondary | ICD-10-CM | POA: Insufficient documentation

## 2014-01-16 NOTE — Progress Notes (Signed)
Medical Nutrition Therapy:  Appt start time: 1400 end time:  1500.   Assessment:  Primary concerns today: Leah Cochran is here here today since her recent blood work showed that her glucose was high and has gained weight since having her second child. Would like to lose to 10-15 more pounds.  Hgb A1c was 6.7%. Will be follow up with doctor by the end of the year. Started making changes to lifestyle before doctor made suggestions.  Lives with her husband and 2 children. Tries to eat of fresh fruit and vegetables and avoids white bread. Had been less active than before, eating out more, having sweet coffee, and some muffins. After she had her yearly physical in March/April she started to drink more water, less sugar, and started going back to boot camp and running. Lost a few lbs since March (after gaining some more weight).   Husband does most of cooking. [place] in [employer] [place] and sometimes works long days.   Using a small plate, eating more slowly, trying to stop eating before really full. Drinking water before meals to help feel full.   Feels like she needs to work on being consistent with working out and watching sweet. Feeling motivated to lose weight. Started to notice that clothes are fitting better and feeling like she has more energy.   Needs to work on sleeping more. Checks on kids several times overnight. Had some stress at work which was keeping up her up work.   Preferred Learning Style:   No preference indicated   Learning Readiness:   Ready  MEDICATIONS: vitamins   DIETARY INTAKE:  Usual eating pattern includes 2-3 meals and 2-3 snacks per day.  Avoided foods include pork or crustaceans   24-hr recall:  B ( AM): workday- oatmeal (less sugar or plain) or green smoothie (greens, flax, apples, berries, water, banana/pineapple), weekend- [place] bacon, eggs, wheat toast with orange or cranberry (trying to cut down), coffee with cream and sugar or water, or nothing a  few times per week Snk (10-11AM): almonds, string cheese, craisons, fruit or applesauce  L ( PM): brings leftovers or salad, subway 6 in sub with veggie chips, steamable veggies  Snk ( PM):almonds, pistachios, string cheese, craisons, granola bar, chips, chocolate  D ( PM): 1-2 vegetables, meat, rice or couscous or pasta Snk ( PM): popcorn if watching a movie late at night Beverages: water or cranberry/orange/orange pineapple juice, coffee   Usual physical activity: 2-4 x week boot camp (60 minutes), running at the park (60 minutes), exercise at home 30-45 minutes  Estimated energy needs: 1800 calories 200 g carbohydrates 135 g protein 50 g fat  Progress Towards Goal(s):  In progress.   Nutritional Diagnosis:  [place]-2.1 Inpaired nutrition utilization As related to glucose metabolism.  As evidenced by Hgb A1c of 6.7%.    Intervention:  Nutrition counseling provided.  Goals:  Follow Diabetes Meal Plan as instructed  Eat 3 meals and 2-3 snacks, every 3-5 hrs  Limit carbohydrate intake to 30-45 grams carbohydrate/meal  Limit carbohydrate intake to 0-15 grams carbohydrate/snack  Add lean protein foods to meals/snacks  Aim for 30-60 mins of physical activity (150-300 minutes per week)  Try to drink mostly water (eat carbs instead of drinking them)  Teaching Method Utilized:  Visual Auditory Hands on  Handouts given during visit include:  Living Well With Diabetes  Yellow Card  MyPlate Handout  15 g CHO Snacks  Barriers to learning/adherence to lifestyle change: none  Demonstrated degree of  understanding via:  Teach Back   Monitoring/Evaluation:  Dietary intake, exercise, and body weight prn.

## 2014-01-16 NOTE — Patient Instructions (Addendum)
Goals:  Follow Diabetes Meal Plan as instructed  Eat 3 meals and 2-3 snacks, every 3-5 hrs  Limit carbohydrate intake to 30-45 grams carbohydrate/meal  Limit carbohydrate intake to 0-15 grams carbohydrate/snack  Add lean protein foods to meals/snacks  Aim for 30-60 mins of physical activity (150-300 minutes per week)  Try to drink mostly water (eat carbs instead of drinking them)

## 2014-04-29 ENCOUNTER — Encounter: Payer: Self-pay | Admitting: Dietician

## 2015-04-01 ENCOUNTER — Encounter: Payer: Self-pay | Admitting: Emergency Medicine

## 2017-09-27 ENCOUNTER — Encounter: Payer: Self-pay | Admitting: Allergy and Immunology

## 2021-05-12 ENCOUNTER — Other Ambulatory Visit (HOSPITAL_COMMUNITY): Payer: Self-pay | Admitting: Gastroenterology

## 2021-05-12 DIAGNOSIS — R1011 Right upper quadrant pain: Secondary | ICD-10-CM

## 2021-05-15 ENCOUNTER — Other Ambulatory Visit: Payer: Self-pay

## 2021-05-15 ENCOUNTER — Ambulatory Visit (HOSPITAL_COMMUNITY)
Admission: RE | Admit: 2021-05-15 | Discharge: 2021-05-15 | Disposition: A | Discharge status: Home / Self Care | Payer: BC Managed Care – PPO | Source: Ambulatory Visit | Attending: Gastroenterology | Admitting: Gastroenterology

## 2021-05-15 DIAGNOSIS — R1011 Right upper quadrant pain: Secondary | ICD-10-CM | POA: Insufficient documentation

## 2021-05-19 ENCOUNTER — Other Ambulatory Visit (HOSPITAL_COMMUNITY): Payer: Self-pay | Admitting: Gastroenterology

## 2021-05-19 DIAGNOSIS — K760 Fatty (change of) liver, not elsewhere classified: Secondary | ICD-10-CM

## 2021-06-02 ENCOUNTER — Ambulatory Visit (HOSPITAL_COMMUNITY)
Admission: RE | Admit: 2021-06-02 | Discharge: 2021-06-02 | Disposition: A | Discharge status: Home / Self Care | Payer: BC Managed Care – PPO | Source: Ambulatory Visit | Attending: Gastroenterology | Admitting: Gastroenterology

## 2021-06-02 ENCOUNTER — Other Ambulatory Visit: Payer: Self-pay

## 2021-06-02 ENCOUNTER — Encounter (HOSPITAL_COMMUNITY)
Admission: RE | Admit: 2021-06-02 | Discharge: 2021-06-02 | Disposition: A | Discharge status: Home / Self Care | Payer: BC Managed Care – PPO | Source: Ambulatory Visit | Attending: Gastroenterology | Admitting: Gastroenterology

## 2021-06-02 DIAGNOSIS — K760 Fatty (change of) liver, not elsewhere classified: Secondary | ICD-10-CM | POA: Insufficient documentation

## 2021-06-02 DIAGNOSIS — R1011 Right upper quadrant pain: Secondary | ICD-10-CM

## 2021-06-02 MED ORDER — TECHNETIUM TC 99M MEBROFENIN IV KIT
5.3000 | PACK | Freq: Once | INTRAVENOUS | Status: AC
  Administered 2021-06-02: 5.3 via INTRAVENOUS

## 2023-05-03 ENCOUNTER — Other Ambulatory Visit: Payer: Self-pay | Admitting: Obstetrics and Gynecology

## 2023-05-03 DIAGNOSIS — R928 Other abnormal and inconclusive findings on diagnostic imaging of breast: Secondary | ICD-10-CM

## 2023-05-18 ENCOUNTER — Ambulatory Visit
Admission: RE | Admit: 2023-05-18 | Discharge: 2023-05-18 | Disposition: A | Discharge status: Home / Self Care | Payer: BC Managed Care – PPO | Source: Ambulatory Visit | Attending: Obstetrics and Gynecology | Admitting: Obstetrics and Gynecology

## 2023-05-18 ENCOUNTER — Ambulatory Visit: Payer: BC Managed Care – PPO

## 2023-05-18 DIAGNOSIS — R928 Other abnormal and inconclusive findings on diagnostic imaging of breast: Secondary | ICD-10-CM

## 2023-06-03 IMAGING — NM NM HEPATO W/GB/PHARM/[PERSON_NAME]
2 series · 12 of 12 positions shown · non-contrast
Comparison: None.

CLINICAL DATA: Worsening right upper quadrant pain.

EXAM:
NUCLEAR MEDICINE HEPATOBILIARY IMAGING WITH GALLBLADDER EF
TECHNIQUE: Sequential images of the abdomen were obtained [DATE] minutes
following intravenous administration of radiopharmaceutical. After
oral ingestion of Ensure, gallbladder ejection fraction was
determined. At 60 min, normal ejection fraction is greater than 33%.
RADIOPHARMACEUTICALS:  5.3 mCi Qc-99m  Choletec IV

[Series 1: biliary · 3.25mm/px · 6 of 60 frames shown]
[frame 6/60]
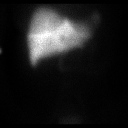
[frame 16/60]
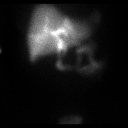
[frame 26/60]
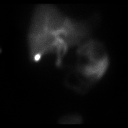
[frame 36/60]
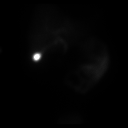
[frame 46/60]
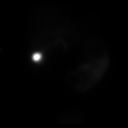
[frame 56/60]
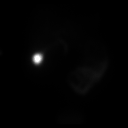

[Series 2: gbef · 3.25mm/px · 6 of 60 frames shown]
[frame 6/60]
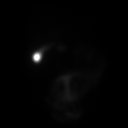
[frame 16/60]
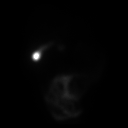
[frame 26/60]
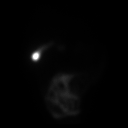
[frame 36/60]
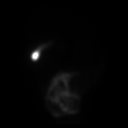
[frame 46/60]
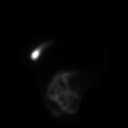
[frame 56/60]
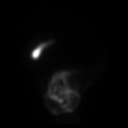

[12 of 12 positions shown; findings below may reference images not displayed]

FINDINGS: Prompt uptake and biliary excretion of activity by the liver is
seen. Gallbladder activity is visualized, consistent with patency of
cystic duct. Biliary activity passes into small bowel, consistent
with patent common bile duct.

Calculated gallbladder ejection fraction is 33%. (Normal gallbladder
ejection fraction with Ensure is greater than 33%.)
IMPRESSION: Borderline low gallbladder ejection fraction at 33%, which may be
associated with biliary dyskinesia.
